# Patient Record
Sex: Male | Born: 1975
Health system: Southern US, Community
[De-identification: ages and names within clinical notes are randomized; demographics above are authoritative.]

## PROBLEM LIST (undated history)

## (undated) DIAGNOSIS — T7840XA Allergy, unspecified, initial encounter: Secondary | ICD-10-CM

## (undated) DIAGNOSIS — F419 Anxiety disorder, unspecified: Secondary | ICD-10-CM

## (undated) HISTORY — DX: Allergy, unspecified, initial encounter: T78.40XA

## (undated) HISTORY — PX: OTHER SURGICAL HISTORY: SHX169

## (undated) HISTORY — DX: Anxiety disorder, unspecified: F41.9

---

## 1998-06-22 ENCOUNTER — Emergency Department (HOSPITAL_COMMUNITY): Admission: EM | Admit: 1998-06-22 | Discharge: 1998-06-22 | Payer: Self-pay | Admitting: Internal Medicine

## 1998-10-14 ENCOUNTER — Emergency Department (HOSPITAL_COMMUNITY): Admission: EM | Admit: 1998-10-14 | Discharge: 1998-10-14 | Payer: Self-pay | Admitting: Emergency Medicine

## 2017-07-25 ENCOUNTER — Ambulatory Visit (INDEPENDENT_AMBULATORY_CARE_PROVIDER_SITE_OTHER): Payer: BLUE CROSS/BLUE SHIELD

## 2017-07-25 ENCOUNTER — Other Ambulatory Visit: Payer: Self-pay

## 2017-07-25 ENCOUNTER — Encounter: Payer: Self-pay | Admitting: Family Medicine

## 2017-07-25 ENCOUNTER — Ambulatory Visit: Payer: BLUE CROSS/BLUE SHIELD | Admitting: Family Medicine

## 2017-07-25 VITALS — BP 126/78 | HR 111 | Temp 99.6°F | Resp 17 | Ht 73.0 in | Wt 190.2 lb

## 2017-07-25 DIAGNOSIS — M67479 Ganglion, unspecified ankle and foot: Secondary | ICD-10-CM

## 2017-07-25 DIAGNOSIS — K429 Umbilical hernia without obstruction or gangrene: Secondary | ICD-10-CM

## 2017-07-25 DIAGNOSIS — K4020 Bilateral inguinal hernia, without obstruction or gangrene, not specified as recurrent: Secondary | ICD-10-CM | POA: Diagnosis not present

## 2017-07-25 DIAGNOSIS — Z23 Encounter for immunization: Secondary | ICD-10-CM

## 2017-07-25 NOTE — Progress Notes (Signed)
Chief Complaint  Patient presents with  . New Patient (Initial Visit)    establish care.  Pt has two concerns 1) hernia x years painful on consistent basis now, pain level flucuate 3-8/10, 2) left ankle knot x few months, pain level 5/10.     HPI   Hernia Pt reports that he has been  This 42 y.o. male notes a several  yeas history of a small lump around his umbilicus. Although it has been asymptomatic most of this time, over the last several months Corrigan notes that it has gotten steadily larger. During this time, he also notes that the hernia has become more uncomfortable, particularly with lifting or other activities.  Ganglion cyst He reports that he has had a bump on his ankle for a few months The pain is 5/10 and feels like pressure He does not recall any specific injury He is otherwise having normal range of motion   Past Medical History:  Diagnosis Date  . Allergy   . Anxiety     Current Outpatient Medications  Medication Sig Dispense Refill  . ibuprofen (ADVIL,MOTRIN) 200 MG tablet Take 200 mg by mouth every 6 (six) hours as needed.    . traMADol (ULTRAM) 50 MG tablet Take 1 tablet (50 mg total) by mouth every 8 (eight) hours as needed. 10 tablet 0   No current facility-administered medications for this visit.     Allergies: No Known Allergies  Past Surgical History:  Procedure Laterality Date  . adnoidectomy      Social History   Socioeconomic History  . Marital status: Soil scientist    Spouse name: Not on file  . Number of children: Not on file  . Years of education: Not on file  . Highest education level: Not on file  Occupational History  . Not on file  Social Needs  . Financial resource strain: Not on file  . Food insecurity:    Worry: Not on file    Inability: Not on file  . Transportation needs:    Medical: Not on file    Non-medical: Not on file  Tobacco Use  . Smoking status: Former Smoker    Types: Cigarettes  . Smokeless tobacco:  Never Used  Substance and Sexual Activity  . Alcohol use: Yes    Alcohol/week: 1.8 oz    Types: 3 Standard drinks or equivalent per week  . Drug use: Not on file  . Sexual activity: Yes  Lifestyle  . Physical activity:    Days per week: Not on file    Minutes per session: Not on file  . Stress: Not on file  Relationships  . Social connections:    Talks on phone: Not on file    Gets together: Not on file    Attends religious service: Not on file    Active member of club or organization: Not on file    Attends meetings of clubs or organizations: Not on file    Relationship status: Not on file  Other Topics Concern  . Not on file  Social History Narrative  . Not on file    Family History  Problem Relation Age of Onset  . Mental illness Mother   . Heart disease Mother   . Cancer Neg Hx   . Asthma Neg Hx      ROS Review of Systems See HPI Constitution: No fevers or chills No malaise No diaphoresis Skin: No rash or itching Eyes: no blurry vision, no double vision  GU: no dysuria or hematuria Neuro: no dizziness or headaches all others reviewed and negative   Objective: Vitals:   07/25/17 1520  BP: 126/78  Pulse: (!) 111  Resp: 17  Temp: 99.6 F (37.6 C)  TempSrc: Oral  SpO2: 98%  Weight: 190 lb 3.2 oz (86.3 kg)  Height: 6\' 1"  (1.854 m)    Physical Exam  Constitutional: He is oriented to person, place, and time. He appears well-developed and well-nourished.  HENT:  Head: Normocephalic and atraumatic.  Eyes: Conjunctivae and EOM are normal.  Neck: Normal range of motion. Neck supple.  Cardiovascular: Normal rate, regular rhythm and normal heart sounds.  No murmur heard. Pulmonary/Chest: Effort normal and breath sounds normal. No stridor. No respiratory distress.  Abdominal: Soft. Normal appearance and bowel sounds are normal. He exhibits no distension and no mass. There is no tenderness. There is no rebound and no guarding. A hernia is present. Hernia  confirmed positive in the right inguinal area and confirmed positive in the left inguinal area.  Umbilical defect that is reducible   Neurological: He is alert and oriented to person, place, and time.  Skin: Skin is warm. Capillary refill takes less than 2 seconds.  Psychiatric: He has a normal mood and affect. His behavior is normal. Judgment and thought content normal.   CLINICAL DATA:  Left ankle ganglion cyst  EXAM: LEFT ANKLE COMPLETE - 3+ VIEW  COMPARISON:  None.  FINDINGS: There is no evidence of fracture, dislocation, or joint effusion. There is no evidence of arthropathy or other focal bone abnormality. Soft tissues are unremarkable.  IMPRESSION: No acute abnormality noted.   Electronically Signed   By: Inez Catalina M.D.   On: 07/25/2017 16:30   Assessment and Plan Justin was seen today for new patient (initial visit).  Diagnoses and all orders for this visit:  Non-recurrent bilateral inguinal hernia without obstruction or gangrene Umbilical hernia without obstruction and without gangrene -     Ambulatory referral to General Surgery Referral placed for general surgery   Discussed conservative mgmt vs surgical repair Discussed s/sx of incarceration  Ganglion cyst of foot -  Normal xray  Advised pt to return for ganglion cyst excision  -     DG Ankle Complete Left; Future  Need for Tdap vaccination -     Tdap vaccine greater than or equal to 7yo IM     Arjan Strohm A [employer]

## 2017-07-25 NOTE — Patient Instructions (Addendum)
   IF you received an x-ray today, you will receive an invoice from [facility]. Please contact [facility] at [phone] with questions or concerns regarding your invoice.   IF you received labwork today, you will receive an invoice from [facility]. Please contact [facility] at [phone] with questions or concerns regarding your invoice.   Our billing staff will not be able to assist you with questions regarding bills from these companies.  You will be contacted with the lab results as soon as they are available. The fastest way to get your results is to activate your My Chart account. Instructions are located on the last page of this paperwork. If you have not heard from us regarding the results in 2 weeks, please contact this office.     Ganglion Cyst A ganglion cyst is a noncancerous, fluid-filled lump that occurs near joints or tendons. The ganglion cyst grows out of a joint or the lining of a tendon. It most often develops in the hand or wrist, but it can also develop in the shoulder, elbow, hip, knee, ankle, or foot. The round or oval ganglion cyst can be the size of a pea or larger than a grape. Increased activity may enlarge the size of the cyst because more fluid starts to build up. What are the causes? It is not known what causes a ganglion cyst to grow. However, it may be related to:  Inflammation or irritation around the joint.  An injury.  Repetitive movements or overuse.  Arthritis.  What increases the risk? Risk factors include:  Being a woman.  Being age 20-50.  What are the signs or symptoms? Symptoms may include:  A lump. This most often appears on the hand or wrist, but it can occur in other areas of the body.  Tingling.  Pain.  Numbness.  Muscle weakness.  Weak grip.  Less movement in a joint.  How is this diagnosed? Ganglion cysts are most often diagnosed based on a physical exam. Your health care provider will feel the  lump and may shine a light alongside it. If it is a ganglion cyst, a light often shines through it. Your health care provider may order an X-ray, ultrasound, or MRI to rule out other conditions. How is this treated? Ganglion cysts usually go away on their own without treatment. If pain or other symptoms are involved, treatment may be needed. Treatment is also needed if the ganglion cyst limits your movement or if it gets infected. Treatment may include:  Wearing a brace or splint on your wrist or finger.  Taking anti-inflammatory medicine.  Draining fluid from the lump with a needle (aspiration).  Injecting a steroid into the joint.  Surgery to remove the ganglion cyst.  Follow these instructions at home:  Do not press on the ganglion cyst, poke it with a needle, or hit it.  Take medicines only as directed by your health care provider.  Wear your brace or splint as directed by your health care provider.  Watch your ganglion cyst for any changes.  Keep all follow-up visits as directed by your health care provider. This is important. Contact a health care provider if:  Your ganglion cyst becomes larger or more painful.  You have increased redness, red streaks, or swelling.  You have pus coming from the lump.  You have weakness or numbness in the affected area.  You have a fever or chills. This information is not intended to replace advice given to you by your   health care provider. Make sure you discuss any questions you have with your health care provider. Document Released: 02/27/2000 Document Revised: 08/07/2015 Document Reviewed: 08/14/2013 Elsevier Interactive Patient Education  2018 [employer].   Inguinal Hernia, Adult An inguinal hernia is when fat or the intestines push through the area where the leg meets the lower abdomen (groin) and create a rounded lump (bulge). This condition develops over time. There are three types of inguinal hernias. These types  include:  Hernias that can be pushed back into the belly (are reducible).  Hernias that are not reducible (are incarcerated).  Hernias that are not reducible and lose their blood supply (are strangulated). This type of hernia requires emergency surgery.  What are the causes? This condition is caused by having a weak spot in the muscles or tissue. This weakness lets the hernia poke through. This condition can be triggered by:  Suddenly straining the muscles of the lower abdomen.  Lifting heavy objects.  Straining to have a bowel movement. Difficult bowel movements (constipation) can lead to this.  Coughing.  What increases the risk? This condition is more likely to develop in:  Men.  Pregnant women.  People who: ? Are overweight. ? Work in jobs that require long periods of standing or heavy lifting. ? Have had an inguinal hernia before. ? Smoke or have lung disease. These factors can lead to long-lasting (chronic) coughing.  What are the signs or symptoms? Symptoms can depend on the size of the hernia. Often, a small inguinal hernia has no symptoms. Symptoms of a larger hernia include:  A lump in the groin. This is easier to see when the person is standing. It might not be visible when he or she is lying down.  Pain or burning in the groin. This occurs especially when lifting, straining, or coughing.  A dull ache or a feeling of pressure in the groin.  A lump in the scrotum in men.  Symptoms of a strangulated inguinal hernia can include:  A bulge in the groin that is very painful and tender to the touch.  A bulge that turns red or purple.  Fever, nausea, and vomiting.  The inability to have a bowel movement or to pass gas.  How is this diagnosed? This condition is diagnosed with a medical history and physical exam. Your health care provider may feel your groin area and ask you to cough. How is this treated? Treatment for this condition varies depending on the  size of your hernia and whether you have symptoms. If you do not have symptoms, your health care provider may have you watch your hernia carefully and come in for follow-up visits. If your hernia is larger or if you have symptoms, your treatment will include surgery. Follow these instructions at home: Lifestyle  Drink enough fluid to keep your urine clear or pale yellow.  Eat a diet that includes a lot of fiber. Eat plenty of fruits, vegetables, and whole grains. Talk with your health care provider if you have questions.  Avoid lifting heavy objects.  Avoid standing for long periods of time.  Do not use tobacco products, including cigarettes, chewing tobacco, or e-cigarettes. If you need help quitting, ask your health care provider.  Maintain a healthy weight. General instructions  Do not try to force the hernia back in.  Watch your hernia for any changes in color or size. Let your health care provider know if any changes occur.  Take over-the-counter and prescription medicines only as  told by your health care provider.  Keep all follow-up visits as told by your health care provider. This is important. Contact a health care provider if:  You have a fever.  You have new symptoms.  Your symptoms get worse. Get help right away if:  You have pain in the groin that suddenly gets worse.  A bulge in the groin gets bigger suddenly and does not go down.  You are a man and you have a sudden pain in the scrotum, or the size of your scrotum suddenly changes.  A bulge in the groin area becomes red or purple and is painful to the touch.  You have nausea or vomiting that does not go away.  You feel your heart beating a lot more quickly than normal.  You cannot have a bowel movement or pass gas. This information is not intended to replace advice given to you by your health care provider. Make sure you discuss any questions you have with your health care provider. Document Released:  07/18/2008 Document Revised: 08/07/2015 Document Reviewed: 01/09/2014 Elsevier Interactive Patient Education  2018 [employer].

## 2017-08-11 ENCOUNTER — Other Ambulatory Visit: Payer: Self-pay

## 2017-08-11 ENCOUNTER — Ambulatory Visit: Payer: BLUE CROSS/BLUE SHIELD | Admitting: Family Medicine

## 2017-08-11 ENCOUNTER — Encounter: Payer: Self-pay | Admitting: Family Medicine

## 2017-08-11 VITALS — BP 134/88 | HR 87 | Temp 99.4°F | Resp 17 | Ht 73.0 in | Wt 189.0 lb

## 2017-08-11 DIAGNOSIS — M67479 Ganglion, unspecified ankle and foot: Secondary | ICD-10-CM

## 2017-08-11 MED ORDER — TRAMADOL HCL 50 MG PO TABS: 50.0000 mg | ORAL_TABLET | Freq: Three times a day (TID) | ORAL | 0 refills | Status: DC | PRN

## 2017-08-11 NOTE — Progress Notes (Signed)
Chief Complaint  Patient presents with  . ganglion cyst removal    noticed 3 months ago, painful and sore.  Pain level 4/10, and takes ibuprofen for the pain    HPI  Patient is here for ganglion cyst removal  He reports that the pain is 4/10 in the left foot Along the lateral aspect He has had this for 3 months Ibuprofen was helping   Past Medical History:  Diagnosis Date  . Allergy   . Anxiety     Current Outpatient Medications  Medication Sig Dispense Refill  . ibuprofen (ADVIL,MOTRIN) 200 MG tablet Take 200 mg by mouth every 6 (six) hours as needed.    . traMADol (ULTRAM) 50 MG tablet Take 1 tablet (50 mg total) by mouth every 8 (eight) hours as needed. 10 tablet 0   No current facility-administered medications for this visit.     Allergies: No Known Allergies  Past Surgical History:  Procedure Laterality Date  . adnoidectomy      Social History   Socioeconomic History  . Marital status: Soil scientist    Spouse name: Not on file  . Number of children: Not on file  . Years of education: Not on file  . Highest education level: Not on file  Occupational History  . Not on file  Social Needs  . Financial resource strain: Not on file  . Food insecurity:    Worry: Not on file    Inability: Not on file  . Transportation needs:    Medical: Not on file    Non-medical: Not on file  Tobacco Use  . Smoking status: Former Smoker    Types: Cigarettes  . Smokeless tobacco: Never Used  Substance and Sexual Activity  . Alcohol use: Yes    Alcohol/week: 1.8 oz    Types: 3 Standard drinks or equivalent per week  . Drug use: Not on file  . Sexual activity: Yes  Lifestyle  . Physical activity:    Days per week: Not on file    Minutes per session: Not on file  . Stress: Not on file  Relationships  . Social connections:    Talks on phone: Not on file    Gets together: Not on file    Attends religious service: Not on file    Active member of club or  organization: Not on file    Attends meetings of clubs or organizations: Not on file    Relationship status: Not on file  Other Topics Concern  . Not on file  Social History Narrative  . Not on file    Family History  Problem Relation Age of Onset  . Mental illness Mother   . Heart disease Mother   . Cancer Neg Hx   . Asthma Neg Hx      ROS Review of Systems See HPI Constitution: No fevers or chills No malaise No diaphoresis Skin: No rash or itching Eyes: no blurry vision, no double vision GU: no dysuria or hematuria Neuro: no dizziness or headaches  all others reviewed and negative   Objective: Vitals:   08/11/17 1543  BP: 134/88  Pulse: 87  Resp: 17  Temp: 99.4 F (37.4 C)  TempSrc: Oral  SpO2: 98%  Weight: 189 lb (85.7 kg)  Height: 6\' 1"  (1.854 m)    Physical Exam  Constitutional: He is oriented to person, place, and time. He appears well-developed and well-nourished.  HENT:  Head: Normocephalic and atraumatic.  Cardiovascular: Normal rate, regular rhythm  and normal heart sounds.  Pulmonary/Chest: Effort normal and breath sounds normal. No respiratory distress.  Musculoskeletal:  Cyst inferior to the lateral malleolus  Neurological: He is alert and oriented to person, place, and time.   Procedure Note Procedure explained. Questions solicited and answered. Informed verbal consent given. Time out performed. Using lidocaine 6cc the area was anesthesized. Using an 11 blade the skin opened and using blunt dissection the fascia removed from the cyst. The cyst was dissected out and only had clear fluid without malodor Incision was J shaped about 1.5cm horizontal and 1cm vertical  Good hemeostasis was achieved Skin closed with dermabond so it could heal by approximation Gave home care handout.  Pt tolerated procedure well.    Assessment and Plan Ernan was seen today for ganglion cyst removal.  Diagnoses and all orders for this visit:  Ganglion cyst of  foot  Other orders -     traMADol (ULTRAM) 50 MG tablet; Take 1 tablet (50 mg total) by mouth every 8 (eight) hours as needed.  resolved with cyst aspiration Home care instructions reviewed Pressure dressing applied and pt to keep dry for 5 days Tramadol for pain  Gomer France A Nolon Rod

## 2017-08-11 NOTE — Patient Instructions (Addendum)
IF you received an x-ray today, you will receive an invoice from [facility]. Please contact [facility] at [phone] with questions or concerns regarding your invoice.   IF you received labwork today, you will receive an invoice from [facility]. Please contact [facility] at [phone] with questions or concerns regarding your invoice.   Our billing staff will not be able to assist you with questions regarding bills from these companies.  You will be contacted with the lab results as soon as they are available. The fastest way to get your results is to activate your My Chart account. Instructions are located on the last page of this paperwork. If you have not heard from [place] regarding the results in 2 weeks, please contact this office.     Elsevier Interactive Patient Education  [employer].  Lipoma Removal Lipoma removal is a surgical procedure to remove a noncancerous (benign) tumor that is made up of fat cells (lipoma). Most lipomas are small and painless and do not require treatment. They can form in many areas of the body but are most common under the skin of the back, shoulders, arms, and thighs. You may need lipoma removal if you have a lipoma that is large, growing, or causing discomfort. Lipoma removal may also be done for cosmetic reasons. Tell a health care provider about:  Any allergies you have.  All medicines you are taking, including vitamins, herbs, eye drops, creams, and over-the-counter medicines.  Any problems you or family members have had with anesthetic medicines.  Any blood disorders you have.  Any surgeries you have had.  Any medical conditions you have.  Whether you are pregnant or may be pregnant. What are the risks? Generally, this is a safe procedure. However, problems may occur, including:  Infection.  Bleeding.  Allergic reactions to medicines.  Damage to nerves or blood vessels near the lipoma.  Scarring.  What  happens before the procedure? Staying hydrated Follow instructions from your health care provider about hydration, which may include:  Up to 2 hours before the procedure - you may continue to drink clear liquids, such as water, clear fruit juice, black coffee, and plain tea.  Eating and drinking restrictions Follow instructions from your health care provider about eating and drinking, which may include:  8 hours before the procedure - stop eating heavy meals or foods such as meat, fried foods, or fatty foods.  6 hours before the procedure - stop eating light meals or foods, such as toast or cereal.  6 hours before the procedure - stop drinking milk or drinks that contain milk.  2 hours before the procedure - stop drinking clear liquids.  Medicines  Ask your health care provider about: ? Changing or stopping your regular medicines. This is especially important if you are taking diabetes medicines or blood thinners. ? Taking medicines such as aspirin and ibuprofen. These medicines can thin your blood. Do not take these medicines before your procedure if your health care provider instructs you not to.  You may be given antibiotic medicine to help prevent infection. General instructions  Ask your health care provider how your surgical site will be marked or identified.  You will have a physical exam. Your health care provider will check the size of the lipoma and whether it can be moved easily.  You may have imaging tests, such as: ? X-rays. ? CT scan. ? MRI.  Plan to have someone take you home from the hospital or clinic.  What happens during the procedure?  To reduce your risk of infection: ? Your health care team will wash or sanitize their hands. ? Your skin will be washed with soap.  You will be given one or more of the following: ? A medicine to help you relax (sedative). ? A medicine to numb the area (local anesthetic). ? A medicine to make you fall asleep (general  anesthetic). ? A medicine that is injected into an area of your body to numb everything below the injection site (regional anesthetic).  An incision will be made over the lipoma or very near the lipoma. The incision may be made in a natural skin line or crease.  Tissues, nerves, and blood vessels near the lipoma will be moved out of the way.  The lipoma and the capsule that surrounds it will be separated from the surrounding tissues.  The lipoma will be removed.  The incision may be closed with stitches (sutures).  A bandage (dressing) will be placed over the incision. What happens after the procedure?  Do not drive for 24 hours if you received a sedative.  Your blood pressure, heart rate, breathing rate, and blood oxygen level will be monitored until the medicines you were given have worn off. This information is not intended to replace advice given to you by your health care provider. Make sure you discuss any questions you have with your health care provider. Document Released: 05/15/2015 Document Revised: 08/07/2015 Document Reviewed: 05/15/2015 Elsevier Interactive Patient Education  [employer].

## 2017-08-25 ENCOUNTER — Encounter: Payer: Self-pay | Admitting: Family Medicine

## 2017-09-12 ENCOUNTER — Encounter: Payer: Self-pay | Admitting: Family Medicine

## 2017-09-19 ENCOUNTER — Encounter: Payer: Self-pay | Admitting: Family Medicine

## 2017-09-19 ENCOUNTER — Other Ambulatory Visit: Payer: Self-pay

## 2017-09-19 ENCOUNTER — Ambulatory Visit: Payer: BLUE CROSS/BLUE SHIELD | Admitting: Family Medicine

## 2017-09-19 VITALS — BP 128/86 | HR 82 | Temp 99.5°F | Resp 17 | Ht 73.0 in | Wt 190.8 lb

## 2017-09-19 DIAGNOSIS — L821 Other seborrheic keratosis: Secondary | ICD-10-CM

## 2017-09-19 DIAGNOSIS — D229 Melanocytic nevi, unspecified: Secondary | ICD-10-CM

## 2017-09-19 MED ORDER — LIDOCAINE HCL 2 % IJ SOLN: 1.0000 mL | Freq: Once | INTRAMUSCULAR | Status: DC

## 2017-09-19 NOTE — Patient Instructions (Addendum)
IF you received an x-ray today, you will receive an invoice from [facility]. Please contact [facility] at [phone] with questions or concerns regarding your invoice.   IF you received labwork today, you will receive an invoice from [facility]. Please contact [facility] at [phone] with questions or concerns regarding your invoice.   Our billing staff will not be able to assist you with questions regarding bills from these companies.  You will be contacted with the lab results as soon as they are available. The fastest way to get your results is to activate your My Chart account. Instructions are located on the last page of this paperwork. If you have not heard from [place] regarding the results in 2 weeks, please contact this office.    Excision of Skin Lesions Excision of a skin lesion refers to the removal of a section of skin by making small cuts (incisions) in the skin. This procedure may be done to remove a cancerous (malignant) or noncancerous (benign) growth on the skin. It is typically done to treat or prevent cancer or infection. It may also be done to improve cosmetic appearance. The procedure may be done to remove:  Cancerous growths, such as basal cell carcinoma, squamous cell carcinoma, or melanoma.  Noncancerous growths, such as a cyst or lipoma.  Growths, such as moles or skin tags, which may be removed for cosmetic reasons.  Various excision or surgical techniques may be used depending on your condition, the location of the lesion, and your overall health. Tell a health care provider about:  Any allergies you have.  All medicines you are taking, including vitamins, herbs, eye drops, creams, and over-the-counter medicines.  Any problems you or family members have had with anesthetic medicines.  Any blood disorders you have.  Any surgeries you have had.  Any medical conditions you have.  Whether you are pregnant or may be pregnant. What are  the risks? Generally, this is a safe procedure. However, problems may occur, including:  Bleeding.  Infection.  Scarring.  Recurrence of the cyst, lipoma, or cancer.  Changes in skin sensation or appearance, such as discoloration or swelling.  Reaction to the anesthetics.  Allergic reaction to surgical materials or ointments.  Damage to nerves, blood vessels, muscles, or other structures.  Continued pain.  What happens before the procedure?  Ask your health care provider about: ? Changing or stopping your regular medicines. This is especially important if you are taking diabetes medicines or blood thinners. ? Taking medicines such as aspirin and ibuprofen. These medicines can thin your blood. Do not take these medicines before your procedure if your health care provider instructs you not to.  You may be asked to take certain medicines.  You may be asked to stop smoking.  You may have an exam or testing.  Plan to have someone take you home after the procedure.  Plan to have someone help you with activities during recovery. What happens during the procedure?  To reduce your risk of infection: ? Your health care team will wash or sanitize their hands. ? Your skin will be washed with soap.  You will be given a medicine to numb the area (local anesthetic).  One of the following excision techniques will be performed.  At the end of any of these procedures, antibiotic ointment will be applied as needed. Each of the following techniques may vary among health care providers and hospitals. Complete Surgical Excision The area of skin that needs to  be removed will be marked with a pen. Using a small scalpel or scissors, the surgeon will gently cut around and under the lesion until it is completely removed. The lesion will be placed in a fluid and sent to the lab for examination. If necessary, bleeding will be controlled with a device that delivers heat (electrocautery). The edges  of the wound may be stitched (sutured) together, and a bandage (dressing) will be applied. This procedure may be performed to treat a cancerous growth or a noncancerous cyst or lesion. Excision of a Cyst The surgeon will make an incision on the cyst. The entire cyst will be removed through the incision. The incision may be closed with sutures. Shave Excision During shave excision, the surgeon will use a small blade or an electrically heated loop instrument to shave off the lesion. This may be done to remove a mole or a skin tag. The wound will usually be left to heal on its own without sutures. Punch Excision During punch excision, the surgeon will use a small tool that is like a cookie cutter or a hole punch to cut a circle shape out of the skin. The outer edges of the skin will be sutured together. This may be done to remove a mole or a scar or to perform a biopsy of the lesion. Mohs Micrographic Surgery During Mohs micrographic surgery, layers of the lesion will be removed with a scalpel or a loop instrument and will be examined right away under a microscope. Layers will be removed until all of the abnormal or cancerous tissue has been removed. This procedure is minimally invasive, and it ensures the best cosmetic outcome. It involves the removal of as little normal tissue as possible. Mohs is usually done to treat skin cancer, such as basal cell carcinoma or squamous cell carcinoma, particularly on the face and ears. Depending on the size of the surgical wound, it may be sutured closed. What happens after the procedure?  Return to your normal activities as told by your health care provider.  Talk with your health care provider to discuss any test results, treatment options, and if necessary, the need for more tests. This information is not intended to replace advice given to you by your health care provider. Make sure you discuss any questions you have with your health care provider. Document  Released: 05/26/2009 Document Revised: 08/07/2015 Document Reviewed: 04/17/2014 Elsevier Interactive Patient Education  [employer].

## 2017-09-19 NOTE — Progress Notes (Signed)
Chief Complaint  Patient presents with  . mole in right ear inside    noticed it has increased in size    HPI  Pt reports that his partner mentioned that he has a mole that is getting larger  He wants it removed and evaluated for any cancer He denies any itching of the area   Past Medical History:  Diagnosis Date  . Allergy   . Anxiety     Current Outpatient Medications  Medication Sig Dispense Refill  . ibuprofen (ADVIL,MOTRIN) 200 MG tablet Take 200 mg by mouth every 6 (six) hours as needed.    . traMADol (ULTRAM) 50 MG tablet Take 1 tablet (50 mg total) by mouth every 8 (eight) hours as needed. (Patient not taking: Reported on 09/19/2017) 10 tablet 0   Current Facility-Administered Medications  Medication Dose Route Frequency Provider Last Rate Last Dose  . lidocaine (XYLOCAINE) 2 % (with pres) injection 20 mg  1 mL Intradermal Once Forrest Moron, MD        Allergies: No Known Allergies  Past Surgical History:  Procedure Laterality Date  . adnoidectomy      Social History   Socioeconomic History  . Marital status: Soil scientist    Spouse name: Not on file  . Number of children: Not on file  . Years of education: Not on file  . Highest education level: Not on file  Occupational History  . Not on file  Social Needs  . Financial resource strain: Not on file  . Food insecurity:    Worry: Not on file    Inability: Not on file  . Transportation needs:    Medical: Not on file    Non-medical: Not on file  Tobacco Use  . Smoking status: Former Smoker    Types: Cigarettes  . Smokeless tobacco: Never Used  Substance and Sexual Activity  . Alcohol use: Yes    Alcohol/week: 1.8 oz    Types: 3 Standard drinks or equivalent per week  . Drug use: Not on file  . Sexual activity: Yes  Lifestyle  . Physical activity:    Days per week: Not on file    Minutes per session: Not on file  . Stress: Not on file  Relationships  . Social connections:    Talks on  phone: Not on file    Gets together: Not on file    Attends religious service: Not on file    Active member of club or organization: Not on file    Attends meetings of clubs or organizations: Not on file    Relationship status: Not on file  Other Topics Concern  . Not on file  Social History Narrative  . Not on file    Family History  Problem Relation Age of Onset  . Mental illness Mother   . Heart disease Mother   . Cancer Neg Hx   . Asthma Neg Hx      ROS Review of Systems See HPI Constitution: No fevers or chills No malaise No diaphoresis Skin: see hpi Eyes: no blurry vision, no double vision GU: no dysuria or hematuria Neuro: no dizziness or headaches all others reviewed and negative   Objective: Vitals:   09/19/17 1552  BP: 128/86  Pulse: 82  Resp: 17  Temp: 99.5 F (37.5 C)  TempSrc: Oral  SpO2: 100%  Weight: 190 lb 12.8 oz (86.5 kg)  Height: 6\' 1"  (1.854 m)    Physical Exam  Constitutional: He appears  well-developed and well-nourished.  HENT:  Head: Normocephalic and atraumatic.  Eyes: Conjunctivae and EOM are normal.  Pulmonary/Chest: Effort normal.  Psychiatric: He has a normal mood and affect. His behavior is normal. Judgment and thought content normal.    Pinna of the right ear with mole <0.5cm with regular borders, normal color     Assessment and Plan Colon was seen today for mole in right ear inside.  Diagnoses and all orders for this visit:  Atypical mole -     lidocaine (XYLOCAINE) 2 % (with pres) injection 20 mg -     Dermatology pathology   Removed and sent to pathology  Tyrome Donatelli A Nolon Rod

## 2017-09-24 ENCOUNTER — Encounter: Payer: Self-pay | Admitting: Family Medicine

## 2017-09-24 NOTE — Addendum Note (Signed)
Addended by: Delia Chimes A on: 09/24/2017 06:07 PM   Modules accepted: Level of Service

## 2018-03-21 ENCOUNTER — Ambulatory Visit (INDEPENDENT_AMBULATORY_CARE_PROVIDER_SITE_OTHER): Payer: BLUE CROSS/BLUE SHIELD

## 2018-03-21 ENCOUNTER — Encounter: Payer: Self-pay | Admitting: Family Medicine

## 2018-03-21 ENCOUNTER — Ambulatory Visit: Payer: BLUE CROSS/BLUE SHIELD | Admitting: Family Medicine

## 2018-03-21 ENCOUNTER — Other Ambulatory Visit: Payer: Self-pay

## 2018-03-21 VITALS — BP 145/84 | HR 96 | Temp 98.7°F | Resp 16 | Ht 73.0 in | Wt 194.6 lb

## 2018-03-21 DIAGNOSIS — F322 Major depressive disorder, single episode, severe without psychotic features: Secondary | ICD-10-CM

## 2018-03-21 DIAGNOSIS — M25551 Pain in right hip: Secondary | ICD-10-CM | POA: Diagnosis not present

## 2018-03-21 DIAGNOSIS — Z23 Encounter for immunization: Secondary | ICD-10-CM | POA: Diagnosis not present

## 2018-03-21 DIAGNOSIS — F411 Generalized anxiety disorder: Secondary | ICD-10-CM

## 2018-03-21 MED ORDER — SERTRALINE HCL 50 MG PO TABS: ORAL_TABLET | ORAL | 3 refills | Status: DC

## 2018-03-21 NOTE — Patient Instructions (Addendum)
Take half tablet by mouth daily for 1-3 days Take a whole tablet after day three if you do not have any allergic reaction After 3 weeks please call to let me know how you are reacting to the medication.  Follow up in 2 months for anxiety and depression    If you have lab work done today you will be contacted with your lab results within the next 2 weeks.  If you have not heard from [place] then please contact us. The fastest way to get your results is to register for My Chart.   IF you received an x-ray today, you will receive an invoice from [facility]. Please contact [facility] at [phone] with questions or concerns regarding your invoice.   IF you received labwork today, you will receive an invoice from [place]. Please contact [facility] at [phone] with questions or concerns regarding your invoice.   Our billing staff will not be able to assist you with questions regarding bills from these companies.  You will be contacted with the lab results as soon as they are available. The fastest way to get your results is to activate your My Chart account. Instructions are located on the last page of this paperwork. If you have not heard from [place] regarding the results in 2 weeks, please contact this office.

## 2018-03-21 NOTE — Progress Notes (Signed)
Established Patient Office Visit  Subjective:  Patient ID: Stephen Hudson, male    DOB: 05/01/1975  Age: 43 y.o. MRN: [id number]  CC:  Chief Complaint  Patient presents with  . psychiatrist referral  . persistent pain where hernia is  x few months consistently.     HPI THURMON MIZELL presents for   He states that he has been irritable and anxious at work He states that always have knots in his stomach, leakage of stool issues and reports that he always had anxiety issues. States that in his teens and early 64s he self-medicated with cigarettes and alcohol In his 36s he stopped drinking and then 7 years he quit smoking He states that he feels irritable all the time. He states that if he puts himself into a social situations he feels very drained after He was started on Paxil back in college.  Depression screen [facility] 2/9 03/21/2018 09/19/2017 08/11/2017 07/25/2017  Decreased Interest 3 0 0 0  Down, Depressed, Hopeless 2 0 0 0  PHQ - 2 Score 5 0 0 0  Altered sleeping 3 - - -  Tired, decreased energy 3 - - -  Change in appetite 3 - - -  Feeling bad or failure about yourself  2 - - -  Trouble concentrating 2 - - -  Moving slowly or fidgety/restless 3 - - -  Suicidal thoughts 0 - - -  PHQ-9 Score 21 - - -  Difficult doing work/chores Very difficult - - -   He reports that he was seen by general surgery who did not believe he had a hernia He continues to have pain on the right side from the hip into the inguinal area He reports that sometimes it is stiff Denies limping IMPRESSION: Small lucency noted in the right femoral neck. Although this is most likely benign cyst, given the patient's history MRI suggested for further evaluation.  2.  Degenerative change both hips.   Electronically Signed   By: Marcello Moores  Register   On: 03/21/2018 11:45  Past Medical History:  Diagnosis Date  . Allergy   . Anxiety     Past Surgical History:  Procedure Laterality Date  . adnoidectomy        Family History  Problem Relation Age of Onset  . Mental illness Mother   . Heart disease Mother   . Cancer Neg Hx   . Asthma Neg Hx     Social History   Socioeconomic History  . Marital status: Soil scientist    Spouse name: Not on file  . Number of children: Not on file  . Years of education: Not on file  . Highest education level: Not on file  Occupational History  . Not on file  Social Needs  . Financial resource strain: Not on file  . Food insecurity:    Worry: Not on file    Inability: Not on file  . Transportation needs:    Medical: Not on file    Non-medical: Not on file  Tobacco Use  . Smoking status: Former Smoker    Types: Cigarettes  . Smokeless tobacco: Never Used  Substance and Sexual Activity  . Alcohol use: Yes    Alcohol/week: 3.0 standard drinks    Types: 3 Standard drinks or equivalent per week  . Drug use: Not on file  . Sexual activity: Yes  Lifestyle  . Physical activity:    Days per week: Not on file    Minutes  per session: Not on file  . Stress: Not on file  Relationships  . Social connections:    Talks on phone: Not on file    Gets together: Not on file    Attends religious service: Not on file    Active member of club or organization: Not on file    Attends meetings of clubs or organizations: Not on file    Relationship status: Not on file  . Intimate partner violence:    Fear of current or ex partner: Not on file    Emotionally abused: Not on file    Physically abused: Not on file    Forced sexual activity: Not on file  Other Topics Concern  . Not on file  Social History Narrative  . Not on file    Outpatient Medications Prior to Visit  Medication Sig Dispense Refill  . ibuprofen (ADVIL,MOTRIN) 200 MG tablet Take 200 mg by mouth every 6 (six) hours as needed.    . traMADol (ULTRAM) 50 MG tablet Take 1 tablet (50 mg total) by mouth every 8 (eight) hours as needed. (Patient not taking: Reported on 09/19/2017) 10 tablet 0  .  lidocaine (XYLOCAINE) 2 % (with pres) injection 20 mg      No facility-administered medications prior to visit.     No Known Allergies  ROS Review of Systems Review of Systems  Constitutional: Negative for activity change, appetite change, chills and fever.  HENT: Negative for congestion, nosebleeds, trouble swallowing and voice change.   Respiratory: Negative for cough, shortness of breath and wheezing.   Gastrointestinal: Negative for diarrhea, nausea and vomiting.  Genitourinary: Negative for difficulty urinating, dysuria, flank pain and hematuria.  Musculoskeletal: see hpi Neurological: Negative for dizziness, speech difficulty, light-headedness and numbness.  See HPI. All other review of systems negative.     Objective:     BP (!) 145/84 (BP Location: Right Arm, Patient Position: Sitting, Cuff Size: Large)   Pulse 96   Temp 98.7 F (37.1 C) (Oral)   Resp 16   Ht 6\' 1"  (1.854 m)   Wt 194 lb 9.6 oz (88.3 kg)   SpO2 100%   BMI 25.67 kg/m  Wt Readings from Last 3 Encounters:  03/21/18 194 lb 9.6 oz (88.3 kg)  09/19/17 190 lb 12.8 oz (86.5 kg)  08/11/17 189 lb (85.7 kg)   Physical Exam Constitutional:      Appearance: Normal appearance.  HENT:     Head: Normocephalic and atraumatic.  Cardiovascular:     Rate and Rhythm: Normal rate and regular rhythm.     Pulses: Normal pulses.  Pulmonary:     Effort: Pulmonary effort is normal. No respiratory distress.     Breath sounds: Normal breath sounds. No wheezing.  Musculoskeletal:     Right hip: He exhibits normal range of motion, normal strength, no tenderness, no bony tenderness, no swelling, no crepitus and no deformity.  Neurological:     Mental Status: He is alert.      Health Maintenance Due  Topic Date Due  . HIV Screening  11/11/1989       Assessment & Plan:   Problem List Items Addressed This Visit      Other   Need for prophylactic vaccination and inoculation against influenza   Major  depressive disorder, single episode, severe ([place]) - Primary    Referral to Psychiatry Will also start SSRI with zoloft Discussed anticipated course of medication and how to titrate  Relevant Medications   sertraline (ZOLOFT) 50 MG tablet   Other Relevant Orders   Ambulatory referral to Psychiatry   Anxiety state    Advised exercise, SSRI and counseling      Relevant Medications   sertraline (ZOLOFT) 50 MG tablet   Other Relevant Orders   Ambulatory referral to Psychiatry   Right hip pain    Xray suggestive of arthritis but will get MRI to evaluate for tendon disease      Relevant Orders   DG HIP UNILAT W OR W/O PELVIS 2-3 VIEWS RIGHT (Completed)   MR HIP RIGHT W WO CONTRAST      Meds ordered this encounter  Medications  . sertraline (ZOLOFT) 50 MG tablet    Sig: Take 1/2 tablet on day 1-3, take one tablet day 4-7 then one tablet daily for 2 weeks    Dispense:  30 tablet    Refill:  3    Follow-up: Return in about 2 months (around 05/20/2018) for anxiety and depression .    Forrest Moron, MD

## 2018-03-23 DIAGNOSIS — F411 Generalized anxiety disorder: Secondary | ICD-10-CM | POA: Insufficient documentation

## 2018-03-23 DIAGNOSIS — M25551 Pain in right hip: Secondary | ICD-10-CM | POA: Insufficient documentation

## 2018-03-23 DIAGNOSIS — F322 Major depressive disorder, single episode, severe without psychotic features: Secondary | ICD-10-CM | POA: Insufficient documentation

## 2018-03-23 DIAGNOSIS — Z23 Encounter for immunization: Secondary | ICD-10-CM | POA: Insufficient documentation

## 2018-03-23 NOTE — Assessment & Plan Note (Signed)
Advised exercise, SSRI and counseling

## 2018-03-23 NOTE — Assessment & Plan Note (Signed)
Referral to Psychiatry Will also start SSRI with zoloft Discussed anticipated course of medication and how to titrate

## 2018-03-23 NOTE — Assessment & Plan Note (Signed)
Xray suggestive of arthritis but will get MRI to evaluate for tendon disease

## 2018-03-28 ENCOUNTER — Encounter: Payer: Self-pay | Admitting: Family Medicine

## 2018-03-29 ENCOUNTER — Other Ambulatory Visit: Payer: Self-pay

## 2018-05-19 ENCOUNTER — Ambulatory Visit: Payer: BLUE CROSS/BLUE SHIELD | Admitting: Family Medicine

## 2018-07-27 ENCOUNTER — Other Ambulatory Visit: Payer: Self-pay | Admitting: Family Medicine

## 2019-05-10 IMAGING — DX DG HIP (WITH OR WITHOUT PELVIS) 2-3V*R*
3 series · 3 of 3 positions shown · non-contrast
Comparison: No recent.

CLINICAL DATA: Right hip pain.

EXAM:
DG HIP (WITH OR WITHOUT PELVIS) 2-3V RIGHT

[pelvis ap]
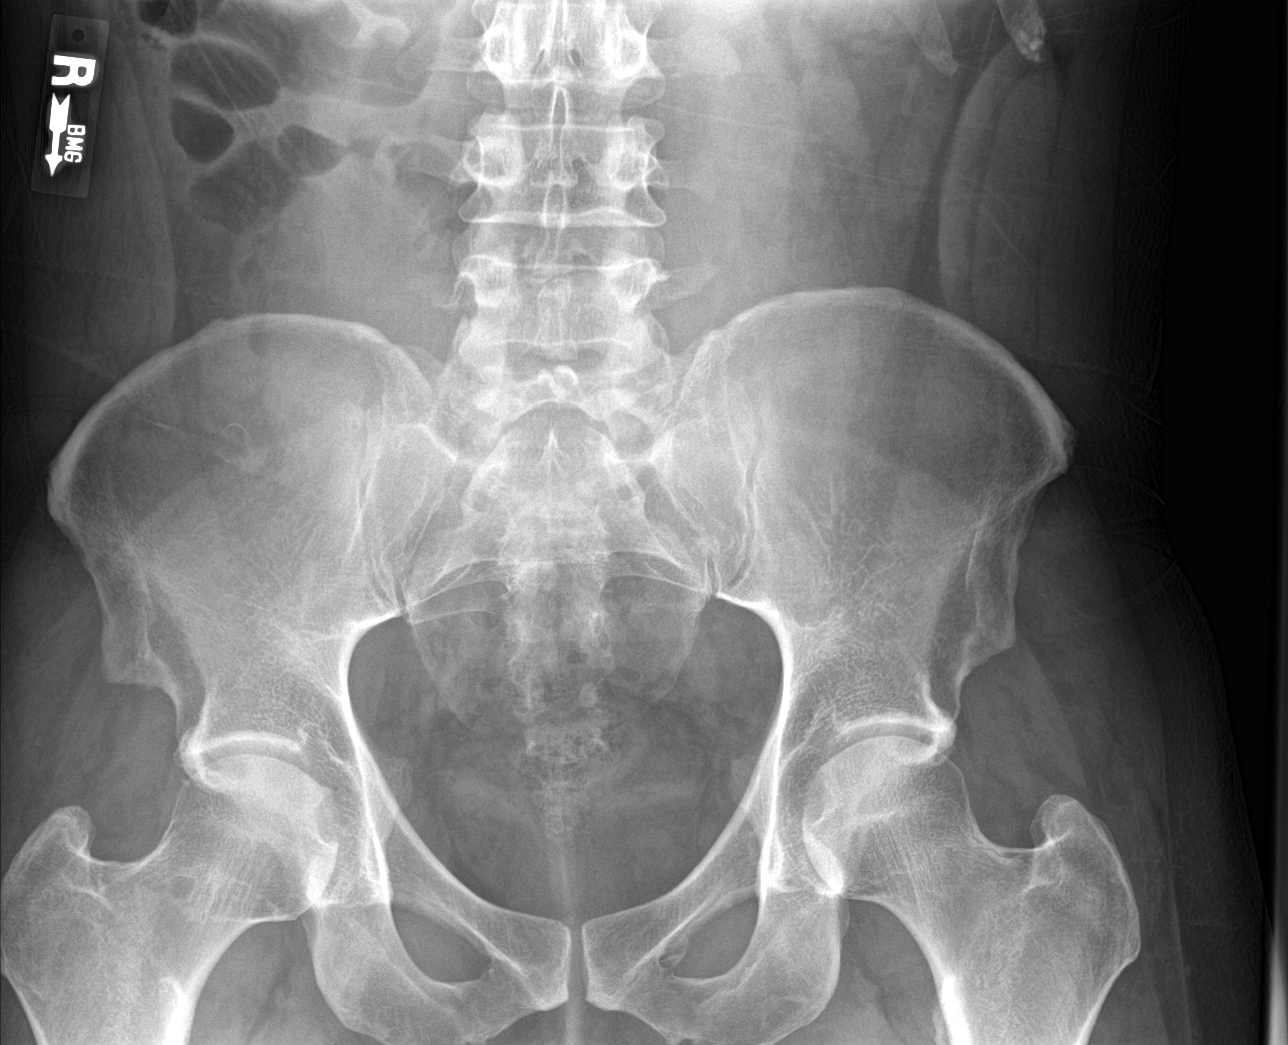

[hip ap]
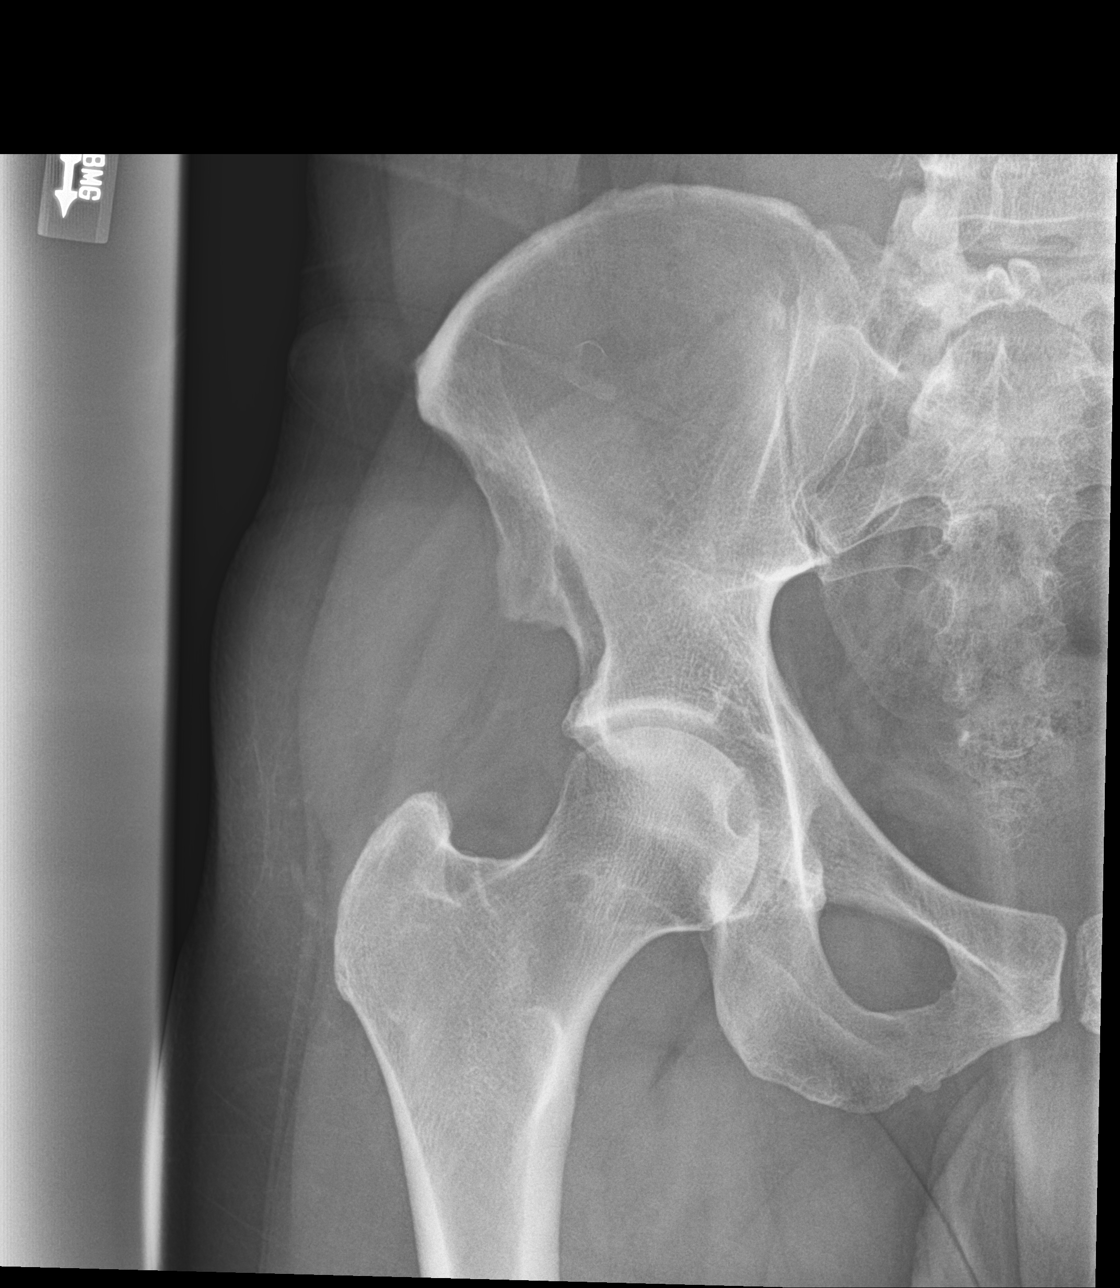

[hip lat]
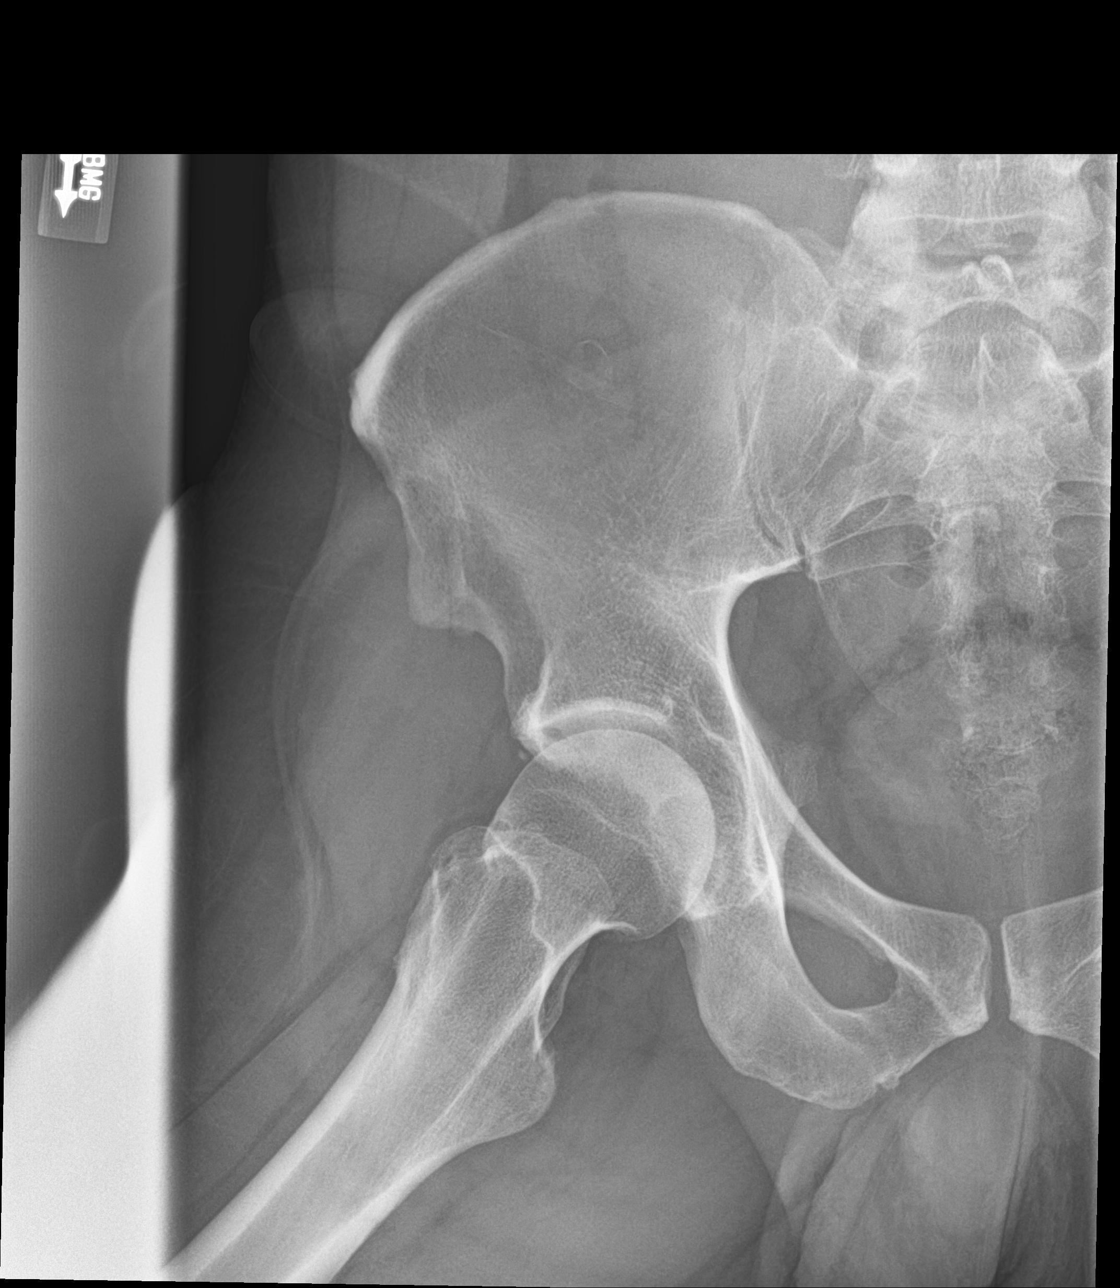

[3 of 3 positions shown; findings below may reference images not displayed]

FINDINGS: Degenerative both hips. No acute bony abnormality identified. No
evidence of fracture or dislocation. Small lucency is noted the
right femoral neck. This is most likely a benign cyst. However given
patient's symptoms MRI of the right hip suggested to further
evaluate.
IMPRESSION: Small lucency noted in the right femoral neck. Although this is most
likely benign cyst, given the patient's history MRI suggested for
further evaluation.

2.  Degenerative change both hips.
